# Patient Record
Sex: Female | Born: 1975 | Race: White | Hispanic: No | Marital: Married | Smoking: Never smoker
Health system: Southern US, Community
[De-identification: ages and names within clinical notes are randomized; demographics above are authoritative.]

---

## 2007-02-09 ENCOUNTER — Ambulatory Visit: Payer: Self-pay | Admitting: Cardiology

## 2007-02-17 ENCOUNTER — Ambulatory Visit: Payer: Self-pay | Admitting: Cardiology

## 2017-11-07 ENCOUNTER — Encounter (HOSPITAL_COMMUNITY): Payer: Self-pay | Admitting: Emergency Medicine

## 2017-11-07 ENCOUNTER — Ambulatory Visit (HOSPITAL_COMMUNITY)
Admission: EM | Admit: 2017-11-07 | Discharge: 2017-11-07 | Disposition: A | Discharge status: Home / Self Care | Payer: PRIVATE HEALTH INSURANCE | Attending: Family Medicine | Admitting: Family Medicine

## 2017-11-07 DIAGNOSIS — J22 Unspecified acute lower respiratory infection: Secondary | ICD-10-CM | POA: Diagnosis not present

## 2017-11-07 MED ORDER — HYDROCOD POLST-CPM POLST ER 10-8 MG/5ML PO SUER: 5.0000 mL | Freq: Two times a day (BID) | ORAL | 0 refills | Status: AC | PRN

## 2017-11-07 MED ORDER — DOXYCYCLINE HYCLATE 100 MG PO CAPS: 100.0000 mg | ORAL_CAPSULE | Freq: Two times a day (BID) | ORAL | 0 refills | Status: AC

## 2017-11-07 NOTE — ED Provider Notes (Signed)
[facility]    CSN: [id number] Arrival date & time: 11/07/17  1656     History   Chief Complaint Chief Complaint  Patient presents with  . Cough    HPI Evelyn Benson is a 42 y.o. female.   42 year old female comes in for 1 to 2-week history of URI symptoms.  She started flulike with body aches, fever with T-max of 103, cough, rhinorrhea, nasal congestion.  States now with continued and worsening productive cough.  Chest pain with cough without shortness of breath or wheezing.  She has been taking OTC cold medication without relief.  Never smoker.  Positive sick contact at work.     History reviewed. No pertinent past medical history.  There are no active problems to display for this patient.   History reviewed. No pertinent surgical history.  OB History   None      Home Medications    Prior to Admission medications   Medication Sig Start Date End Date Taking? Authorizing Provider  Acetaminophen (TYLENOL PO) Take by mouth.   Yes [provider]  chlorpheniramine-HYDROcodone (TUSSIONEX PENNKINETIC ER) 10-8 MG/5ML SUER Take 5 mLs by mouth every 12 (twelve) hours as needed for cough. 11/07/17   Cathie Hoops, Amy V, PA-C  doxycycline (VIBRAMYCIN) 100 MG capsule Take 1 capsule (100 mg total) by mouth 2 (two) times daily. 11/07/17   Belinda Fisher, PA-C    Family History No family history on file.  Social History Social History   Tobacco Use  . Smoking status: Never Smoker  Substance Use Topics  . Alcohol use: Yes  . Drug use: Never     Allergies   Erythromycin base   Review of Systems Review of Systems  Reason unable to perform ROS: See HPI as above.     Physical Exam Triage Vital Signs ED Triage Vitals [11/07/17 1722]  Enc Vitals Group     BP 129/84     Pulse Rate (!) 101     Resp 16     Temp 99.3 F (37.4 C)     Temp src      SpO2 100 %     Weight      Height      Head Circumference      Peak Flow      Pain Score      Pain Loc      Pain  Edu?      Excl. in GC?    No data found.  Updated Vital Signs BP 129/84   Pulse (!) 101   Temp 99.3 F (37.4 C)   Resp 16   LMP 11/07/2017   SpO2 100%   Physical Exam  Constitutional: She is oriented to person, place, and time. She appears well-developed and well-nourished. No distress.  HENT:  Head: Normocephalic and atraumatic.  Right Ear: Tympanic membrane, external ear and ear canal normal. Tympanic membrane is not erythematous and not bulging.  Left Ear: Tympanic membrane, external ear and ear canal normal. Tympanic membrane is not erythematous and not bulging.  Nose: Rhinorrhea present. Right sinus exhibits no maxillary sinus tenderness and no frontal sinus tenderness. Left sinus exhibits no maxillary sinus tenderness and no frontal sinus tenderness.  Mouth/Throat: Uvula is midline, oropharynx is clear and moist and mucous membranes are normal.  Eyes: Pupils are equal, round, and reactive to light. Conjunctivae are normal.  Neck: Normal range of motion. Neck supple.  Cardiovascular: Normal rate, regular rhythm and normal heart sounds. Exam reveals  no gallop and no friction rub.  No murmur heard. Pulmonary/Chest: Effort normal. No accessory muscle usage. No respiratory distress.  Crackles to the right middle field.  No other adventitious lung sounds heard.  Lymphadenopathy:    She has no cervical adenopathy.  Neurological: She is alert and oriented to person, place, and time.  Skin: Skin is warm and dry.  Psychiatric: She has a normal mood and affect. Her behavior is normal. Judgment normal.     UC Treatments / Results  Labs (all labs ordered are listed, but only abnormal results are displayed) Labs Reviewed - No data to display  EKG None  Radiology No results found.  Procedures Procedures (including critical care time)  Medications Ordered in UC Medications - No data to display  Initial Impression / Assessment and Plan / UC Course  I have reviewed the  triage vital signs and the nursing notes.  Pertinent labs & imaging results that were available during my care of the patient were reviewed by me and considered in my medical decision making (see chart for details).    Given crackles on lung exam, will cover for pneumonia with doxycycline.  Tussionex as needed for cough.  Other symptomatic treatment discussed.  Return precautions given.  Patient expresses understanding and agrees to plan.  Final Clinical Impressions(s) / UC Diagnoses   Final diagnoses:  Lower respiratory infection    ED Prescriptions    Medication Sig Dispense Auth. Provider   doxycycline (VIBRAMYCIN) 100 MG capsule Take 1 capsule (100 mg total) by mouth 2 (two) times daily. 20 capsule Yu, Amy V, PA-C   chlorpheniramine-HYDROcodone (TUSSIONEX PENNKINETIC ER) 10-8 MG/5ML SUER Take 5 mLs by mouth every 12 (twelve) hours as needed for cough. 140 mL Threasa Alpha, [place] 11/07/17 1800

## 2017-11-07 NOTE — Discharge Instructions (Addendum)
Given crackles to the right lower base, will cover for pneumonia with doxycycline. Tussionex as needed for cough. Keep hydrated, your urine should be clear to pale yellow in color. Continue tylenol/motrin for pain and fever. Monitor for any worsening of symptoms, chest pain, shortness of breath, wheezing, swelling of the throat, follow up for reevaluation.   For sore throat/cough try using a honey-based tea. Use 3 teaspoons of honey with juice squeezed from half lemon. Place shaved pieces of ginger into 1/2-1 cup of water and warm over stove top. Then mix the ingredients and repeat every 4 hours as needed.

## 2017-11-07 NOTE — ED Triage Notes (Signed)
Pt states "I know I had the flu last week, my fever was 103 for days, and I had body aches so bad, chills, and towards the end of the week this cough developed and I have not been able to shake it".

## 2017-12-03 ENCOUNTER — Encounter (HOSPITAL_COMMUNITY): Payer: Self-pay | Admitting: Emergency Medicine

## 2017-12-03 ENCOUNTER — Other Ambulatory Visit: Payer: Self-pay

## 2017-12-03 ENCOUNTER — Emergency Department (HOSPITAL_COMMUNITY): Payer: PRIVATE HEALTH INSURANCE

## 2017-12-03 ENCOUNTER — Emergency Department (HOSPITAL_COMMUNITY)
Admission: EM | Admit: 2017-12-03 | Discharge: 2017-12-03 | Disposition: A | Discharge status: Home / Self Care | Payer: PRIVATE HEALTH INSURANCE | Attending: Emergency Medicine | Admitting: Emergency Medicine

## 2017-12-03 DIAGNOSIS — Y9389 Activity, other specified: Secondary | ICD-10-CM | POA: Diagnosis not present

## 2017-12-03 DIAGNOSIS — Y929 Unspecified place or not applicable: Secondary | ICD-10-CM | POA: Diagnosis not present

## 2017-12-03 DIAGNOSIS — Z79899 Other long term (current) drug therapy: Secondary | ICD-10-CM | POA: Diagnosis not present

## 2017-12-03 DIAGNOSIS — S20219A Contusion of unspecified front wall of thorax, initial encounter: Secondary | ICD-10-CM

## 2017-12-03 DIAGNOSIS — R0789 Other chest pain: Secondary | ICD-10-CM | POA: Diagnosis not present

## 2017-12-03 DIAGNOSIS — Y999 Unspecified external cause status: Secondary | ICD-10-CM | POA: Insufficient documentation

## 2017-12-03 DIAGNOSIS — S40011A Contusion of right shoulder, initial encounter: Secondary | ICD-10-CM | POA: Diagnosis not present

## 2017-12-03 DIAGNOSIS — S4991XA Unspecified injury of right shoulder and upper arm, initial encounter: Secondary | ICD-10-CM | POA: Diagnosis present

## 2017-12-03 MED ORDER — ONDANSETRON HCL 4 MG PO TABS
4.0000 mg | ORAL_TABLET | Freq: Once | ORAL | Status: DC
  Filled 2017-12-03: qty 1

## 2017-12-03 MED ORDER — IBUPROFEN 400 MG PO TABS: 400.0000 mg | ORAL_TABLET | Freq: Four times a day (QID) | ORAL | 0 refills | Status: AC | PRN

## 2017-12-03 MED ORDER — IBUPROFEN 600 MG PO TABS: 600.0000 mg | ORAL_TABLET | Freq: Four times a day (QID) | ORAL | 0 refills | Status: AC

## 2017-12-03 MED ORDER — CYCLOBENZAPRINE HCL 10 MG PO TABS: 10.0000 mg | ORAL_TABLET | Freq: Three times a day (TID) | ORAL | 0 refills | Status: AC

## 2017-12-03 MED ORDER — CYCLOBENZAPRINE HCL 10 MG PO TABS
10.0000 mg | ORAL_TABLET | Freq: Once | ORAL | Status: DC
  Filled 2017-12-03: qty 1

## 2017-12-03 MED ORDER — IBUPROFEN 800 MG PO TABS
800.0000 mg | ORAL_TABLET | Freq: Once | ORAL | Status: DC
  Filled 2017-12-03: qty 1
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
## 2017-12-03 NOTE — Discharge Instructions (Addendum)
Your vital signs are within normal limits.  Your oxygen level is normal.  The x-ray of your chest is negative for any fracture, dislocation, or injury to the lungs.  You can expect to be sore and stiff over the next few days.  Please use Flexeril if needed for spasm pain. This medication may cause drowsiness. Please do not drink, drive, or participate in activity that requires concentration while taking this medication.  Use ibuprofen with each meal and at bedtime for generalized aching and soreness.  Please see your primary physician or return to the emergency department if any changes in your condition, problems, or concerns.
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
